# Patient Record
Sex: Male | Born: 1970 | Hispanic: No | Marital: Married | Smoking: Never smoker
Health system: Southern US, Community
[De-identification: ages and names within clinical notes are randomized; demographics above are authoritative.]

## PROBLEM LIST (undated history)

## (undated) DIAGNOSIS — N200 Calculus of kidney: Secondary | ICD-10-CM

---

## 2019-12-04 ENCOUNTER — Other Ambulatory Visit: Payer: Self-pay

## 2019-12-04 ENCOUNTER — Emergency Department (HOSPITAL_COMMUNITY)
Admission: EM | Admit: 2019-12-04 | Discharge: 2019-12-04 | Disposition: A | Discharge status: Home / Self Care | Payer: BLUE CROSS/BLUE SHIELD | Attending: Emergency Medicine | Admitting: Emergency Medicine

## 2019-12-04 ENCOUNTER — Encounter (HOSPITAL_COMMUNITY): Payer: Self-pay | Admitting: Emergency Medicine

## 2019-12-04 ENCOUNTER — Emergency Department (HOSPITAL_COMMUNITY): Payer: BLUE CROSS/BLUE SHIELD

## 2019-12-04 DIAGNOSIS — N201 Calculus of ureter: Secondary | ICD-10-CM | POA: Insufficient documentation

## 2019-12-04 DIAGNOSIS — R109 Unspecified abdominal pain: Secondary | ICD-10-CM | POA: Diagnosis present

## 2019-12-04 LAB — COMPREHENSIVE METABOLIC PANEL
ALT: 28 U/L (ref 0–44)
AST: 24 U/L (ref 15–41)
Albumin: 4.5 g/dL (ref 3.5–5.0)
Alkaline Phosphatase: 51 U/L (ref 38–126)
Anion gap: 12 (ref 5–15)
BUN: 12 mg/dL (ref 6–20)
CO2: 21 mmol/L — ABNORMAL LOW (ref 22–32)
Calcium: 9.6 mg/dL (ref 8.9–10.3)
Chloride: 105 mmol/L (ref 98–111)
Creatinine, Ser: 1.32 mg/dL — ABNORMAL HIGH (ref 0.61–1.24)
GFR calc Af Amer: >60 mL/min (ref >60)
GFR calc non Af Amer: >60 mL/min (ref >60)
Glucose, Bld: 146 mg/dL — ABNORMAL HIGH (ref 70–99)
Potassium: 4.5 mmol/L (ref 3.5–5.1)
Sodium: 138 mmol/L (ref 135–145)
Total Bilirubin: 1.2 mg/dL (ref 0.3–1.2)
Total Protein: 7.6 g/dL (ref 6.5–8.1)

## 2019-12-04 LAB — LIPASE, BLOOD: Lipase: 45 U/L (ref 11–51)

## 2019-12-04 LAB — CBC
HCT: 49.4 % (ref 39.0–52.0)
Hemoglobin: 17 g/dL (ref 13.0–17.0)
MCH: 29.2 pg (ref 26.0–34.0)
MCHC: 34.4 g/dL (ref 30.0–36.0)
MCV: 84.9 fL (ref 80.0–100.0)
Platelets: 322 10*3/uL (ref 150–400)
RBC: 5.82 MIL/uL — ABNORMAL HIGH (ref 4.22–5.81)
RDW: 12 % (ref 11.5–15.5)
WBC: 10.8 10*3/uL — ABNORMAL HIGH (ref 4.0–10.5)
nRBC: 0 % (ref 0.0–0.2)

## 2019-12-04 LAB — URINALYSIS, ROUTINE W REFLEX MICROSCOPIC
Bilirubin Urine: NEGATIVE
Glucose, UA: NEGATIVE mg/dL
Ketones, ur: NEGATIVE mg/dL
Leukocytes,Ua: NEGATIVE
Nitrite: NEGATIVE
Protein, ur: 30 mg/dL — AB
RBC / HPF: >50 RBC/hpf — ABNORMAL HIGH (ref 0–5)
Specific Gravity, Urine: 1.016 (ref 1.005–1.030)
pH: 6 (ref 5.0–8.0)

## 2019-12-04 MED ORDER — KETOROLAC TROMETHAMINE 15 MG/ML IJ SOLN
15.0000 mg | Freq: Once | INTRAMUSCULAR | Status: AC
  Administered 2019-12-04: 11:00:00 15 mg via INTRAVENOUS
  Filled 2019-12-04: qty 1

## 2019-12-04 MED ORDER — IBUPROFEN 600 MG PO TABS: 600.0000 mg | ORAL_TABLET | Freq: Three times a day (TID) | ORAL | 0 refills | Status: DC | PRN

## 2019-12-04 MED ORDER — HYDROCODONE-ACETAMINOPHEN 5-325 MG PO TABS
1.0000 | ORAL_TABLET | Freq: Once | ORAL | Status: AC
  Administered 2019-12-04: 13:00:00 1 via ORAL
  Filled 2019-12-04: qty 1

## 2019-12-04 MED ORDER — HYDROCODONE-ACETAMINOPHEN 5-325 MG PO TABS: 1.0000 | ORAL_TABLET | ORAL | 0 refills | Status: DC | PRN

## 2019-12-04 MED ORDER — ONDANSETRON HCL 4 MG/2ML IJ SOLN
4.0000 mg | Freq: Once | INTRAMUSCULAR | Status: AC
  Administered 2019-12-04: 4 mg via INTRAVENOUS
  Filled 2019-12-04: qty 2

## 2019-12-04 MED ORDER — SODIUM CHLORIDE 0.9 % IV BOLUS
1000.0000 mL | Freq: Once | INTRAVENOUS | Status: AC
  Administered 2019-12-04: 1000 mL via INTRAVENOUS

## 2019-12-04 MED ORDER — HYDROMORPHONE HCL 1 MG/ML IJ SOLN
1.0000 mg | Freq: Once | INTRAMUSCULAR | Status: AC
  Administered 2019-12-04: 10:00:00 1 mg via INTRAVENOUS
  Filled 2019-12-04: qty 1

## 2019-12-04 MED ORDER — HYDROMORPHONE HCL 1 MG/ML IJ SOLN
1.0000 mg | Freq: Once | INTRAMUSCULAR | Status: AC
  Administered 2019-12-04: 11:00:00 1 mg via INTRAVENOUS
  Filled 2019-12-04: qty 1

## 2019-12-04 MED ORDER — SODIUM CHLORIDE 0.9% FLUSH: 3.0000 mL | Freq: Once | INTRAVENOUS | Status: DC

## 2019-12-04 NOTE — ED Provider Notes (Signed)
Andrew [name]   CSN: [id number] Arrival date & time: 12/04/19  0913     History Chief Complaint  Patient presents with  . Flank Pain  . Abdominal Pain    Andrew [name] is a 49 y.o. male.  HPI 49 year old male presents with left back pain.  Started all of a sudden at 4 AM and awoke him from sleep.  Seem to get better and was gone for about 30 minutes but then came back and now is more in his left flank.  Vomited twice.  No fevers, hematuria, or dysuria.  Has never had similar symptoms before.  Took some Advil with no relief.  Pain is severe.  History reviewed. No pertinent past medical history.  There are no problems to display for this patient.   History reviewed. No pertinent surgical history.     No family history on file.  Social History   Tobacco Use  . Smoking status: Never Smoker  . Smokeless tobacco: Never Used  Substance Use Topics  . Alcohol use: Not Currently  . Drug use: Never    Home Medications Prior to Admission medications   Medication Sig Start Date End Date Taking? Authorizing Provider  HYDROcodone-acetaminophen (NORCO) 5-325 MG tablet Take 1 tablet by mouth every 4 (four) hours as needed for severe pain. 12/04/19   Pricilla Loveless, MD  ibuprofen (ADVIL) 600 MG tablet Take 1 tablet (600 mg total) by mouth every 8 (eight) hours as needed for mild pain or moderate pain. 12/04/19   Pricilla Loveless, MD    Allergies    Patient has no known allergies.  Review of Systems   Review of Systems  Constitutional: Negative for fever.  Gastrointestinal: Positive for vomiting. Negative for nausea.  Genitourinary: Positive for flank pain. Negative for dysuria and hematuria.  All other systems reviewed and are negative.   Physical Exam Updated Vital Signs BP (!) 144/86 (BP Location: Right Arm)   Pulse 62   Temp 97.6 F (36.4 C) (Oral)   Resp 18   Ht 6\' 1"  (1.854 m)   Wt 93 kg   SpO2 100%   BMI  27.05 kg/m   Physical Exam Vitals and nursing [name] reviewed.  Constitutional:      Appearance: He is well-developed.  HENT:     Head: Normocephalic and atraumatic.     Right Ear: External ear normal.     Left Ear: External ear normal.     Nose: Nose normal.  Eyes:     General:        Right eye: No discharge.        Left eye: No discharge.  Cardiovascular:     Rate and Rhythm: Normal rate and regular rhythm.     Heart sounds: Normal heart sounds.  Pulmonary:     Effort: Pulmonary effort is normal.     Breath sounds: Normal breath sounds.  Abdominal:     Palpations: Abdomen is soft.     Tenderness: There is no abdominal tenderness. There is no right CVA tenderness or left CVA tenderness.  Musculoskeletal:     Cervical back: Neck supple.  Skin:    General: Skin is warm and dry.  Neurological:     Mental Status: He is alert.  Psychiatric:        Mood and Affect: Mood is not anxious.     ED Results / Procedures / Treatments   Labs (all labs ordered are listed, but only  abnormal results are displayed) Labs Reviewed  COMPREHENSIVE METABOLIC PANEL - Abnormal; Notable for the following components:      Result Value   CO2 21 (*)    Glucose, Bld 146 (*)    Creatinine, Ser 1.32 (*)    All other components within normal limits  CBC - Abnormal; Notable for the following components:   WBC 10.8 (*)    RBC 5.82 (*)    All other components within normal limits  URINALYSIS, ROUTINE W REFLEX MICROSCOPIC - Abnormal; Notable for the following components:   APPearance HAZY (*)    Hgb urine dipstick LARGE (*)    Protein, ur 30 (*)    RBC / HPF >50 (*)    Bacteria, UA RARE (*)    All other components within normal limits  LIPASE, BLOOD    EKG None  Radiology CT Renal Stone Study  Result Date: 12/04/2019 CLINICAL DATA:  Left flank pain with nausea and vomiting beginning the morning at 4am. EXAM: CT ABDOMEN AND PELVIS WITHOUT CONTRAST TECHNIQUE: Multidetector CT imaging of the  abdomen and pelvis was performed following the standard protocol without IV contrast. COMPARISON:  None. FINDINGS: Lower chest: No acute abnormality. Somewhat limited evaluation of the abdominal viscera given the lack of IV contrast. Hepatobiliary: Tiny hypodensity in the posterior left liver (series 4, image 13) is too small fully characterize but likely represents a small cyst. Status post cholecystectomy. No biliary dilatation. Pancreas: Unremarkable. No surrounding inflammatory changes. Spleen: Normal in size without focal abnormality. Adrenals/Urinary Tract: Adrenal glands are unremarkable. Kidneys are symmetric in size. No right renal calculi or hydronephrosis. There is mild left hydronephrosis with a 6 mm calculus proximally at the UPJ. Mild left perinephric fat stranding. There is another punctate calculus in the left kidney. Bladder is unremarkable. Stomach/Bowel: Stomach is within normal limits. Appendix appears normal. No evidence of bowel wall thickening, distention, or inflammatory changes. Few scattered colonic diverticula without evidence of diverticulitis. Vascular/Lymphatic: Minimal aortoiliac atherosclerosis without aneurysm. No enlarged abdominal or pelvic lymph nodes. Reproductive: Prostate is unremarkable. Other: No abdominal wall hernia or abnormality. No abdominopelvic ascites. Musculoskeletal: Degenerative disc disease at L1-2 with likely chronic wedging of the L1 vertebral body secondary to degenerative change. IMPRESSION: 1. Mild left hydronephrosis secondary to a 6 mm calculus proximally at the UPJ. 2.  Lumbar spine degenerative disc disease. Aortic Atherosclerosis (ICD10-I70.0). Electronically Signed   By: Emmaline Kluver M.D.   On: 12/04/2019 10:29    Procedures Procedures (including critical care time)  Medications Ordered in ED Medications  sodium chloride 0.9 % bolus 1,000 mL (0 mLs Intravenous Stopped 12/04/19 1110)  HYDROmorphone (DILAUDID) injection 1 mg (1 mg Intravenous  Given 12/04/19 0945)  ondansetron (ZOFRAN) injection 4 mg (4 mg Intravenous Given 12/04/19 0951)  ketorolac (TORADOL) 15 MG/ML injection 15 mg (15 mg Intravenous Given 12/04/19 1046)  HYDROmorphone (DILAUDID) injection 1 mg (1 mg Intravenous Given 12/04/19 1114)  HYDROcodone-acetaminophen (NORCO/VICODIN) 5-325 MG per tablet 1 tablet (1 tablet Oral Given 12/04/19 1230)    ED Course  I have reviewed the triage vital signs and the nursing notes.  Pertinent labs & imaging results that were available during my care of the patient were reviewed by me and considered in my medical decision making (see chart for details).    MDM Rules/Calculators/A&P                      CT scan confirms ureteral stone and shows no other emergent pathology.  Pain now better after second dose Dilaudid and he got Toradol.  Labs overall benign.  Given some fluids.  No UTI.  Given he seems to have adequate pain control, will treat with NSAIDs and hydrocodone and expectant management.  Follow-up with urology.  Discussed return precautions. Final Clinical Impression(s) / ED Diagnoses Final diagnoses:  Left ureteral stone    Rx / DC Orders ED Discharge Orders         Ordered    HYDROcodone-acetaminophen (NORCO) 5-325 MG tablet  Every 4 hours PRN     12/04/19 1242    ibuprofen (ADVIL) 600 MG tablet  Every 8 hours PRN     12/04/19 1242           Sherwood Gambler, MD 12/04/19 1547

## 2019-12-04 NOTE — Discharge Instructions (Addendum)
Return to the ER if you develop fever, uncontrolled vomiting, new or worsening pain, urinary tract infection symptoms such as burning when you urinate, or any other new/concerning symptoms.  Call the urology office tomorrow.  You are being prescribed Ibuprofen. Do not take Advil/Aleve/Motrin/Goody Powders/Naproxen/BC powders/Meloxicam/Diclofenac/Indomethacin and other Nonsteroidal anti-inflammatory medications

## 2019-12-04 NOTE — ED Triage Notes (Signed)
C/o L flank and L sided abd pain since 4am with nausea and vomiting. Denies urinary complaints.

## 2019-12-05 ENCOUNTER — Other Ambulatory Visit: Payer: Self-pay | Admitting: Urology

## 2019-12-05 ENCOUNTER — Other Ambulatory Visit (HOSPITAL_COMMUNITY)
Admission: RE | Admit: 2019-12-05 | Discharge: 2019-12-05 | Disposition: A | Discharge status: Home / Self Care | Payer: BLUE CROSS/BLUE SHIELD | Source: Ambulatory Visit | Attending: Urology | Admitting: Urology

## 2019-12-05 DIAGNOSIS — N200 Calculus of kidney: Secondary | ICD-10-CM

## 2019-12-05 DIAGNOSIS — Z01812 Encounter for preprocedural laboratory examination: Secondary | ICD-10-CM | POA: Insufficient documentation

## 2019-12-05 DIAGNOSIS — Z20822 Contact with and (suspected) exposure to covid-19: Secondary | ICD-10-CM | POA: Insufficient documentation

## 2019-12-05 LAB — SARS CORONAVIRUS 2 (TAT 6-24 HRS): SARS Coronavirus 2: NEGATIVE

## 2019-12-06 ENCOUNTER — Emergency Department (HOSPITAL_COMMUNITY)
Admission: EM | Admit: 2019-12-06 | Discharge: 2019-12-06 | Disposition: A | Discharge status: Home / Self Care | Payer: BLUE CROSS/BLUE SHIELD | Attending: Emergency Medicine | Admitting: Emergency Medicine

## 2019-12-06 ENCOUNTER — Other Ambulatory Visit: Payer: Self-pay

## 2019-12-06 ENCOUNTER — Encounter (HOSPITAL_COMMUNITY): Payer: Self-pay | Admitting: Emergency Medicine

## 2019-12-06 DIAGNOSIS — N201 Calculus of ureter: Secondary | ICD-10-CM | POA: Diagnosis not present

## 2019-12-06 DIAGNOSIS — R1030 Lower abdominal pain, unspecified: Secondary | ICD-10-CM | POA: Diagnosis present

## 2019-12-06 DIAGNOSIS — N23 Unspecified renal colic: Secondary | ICD-10-CM

## 2019-12-06 HISTORY — DX: Calculus of kidney: N20.0

## 2019-12-06 MED ORDER — HYDROMORPHONE HCL 1 MG/ML IJ SOLN
1.0000 mg | Freq: Once | INTRAMUSCULAR | Status: AC
  Administered 2019-12-06: 1 mg via INTRAVENOUS
  Filled 2019-12-06: qty 1

## 2019-12-06 MED ORDER — SODIUM CHLORIDE 0.9 % IV SOLN
1.5000 mg/kg | Freq: Once | INTRAVENOUS | Status: AC
  Administered 2019-12-06: 140 mg via INTRAVENOUS
  Filled 2019-12-06: qty 7

## 2019-12-06 MED ORDER — ONDANSETRON HCL 4 MG/2ML IJ SOLN
4.0000 mg | Freq: Once | INTRAMUSCULAR | Status: AC
  Administered 2019-12-06: 04:00:00 4 mg via INTRAVENOUS
  Filled 2019-12-06: qty 2

## 2019-12-06 NOTE — Discharge Instructions (Signed)
Please continue taking your pain medication while at home, we discussed if this does not improve you may return to the emergency department.  As we discussed, Dr. Annabell Howells wrote a prescription for hydrocodone, take this medication as prescribed.

## 2019-12-06 NOTE — ED Triage Notes (Signed)
Pt c/o left sided flank pain, diagnosed with kidney stones on 2/14. States he is scheduled for lithotripsy 2/18. C/o increased pain, last voided x 1 hour ago. States he has been taking his pain medicine without relief.

## 2019-12-06 NOTE — ED Provider Notes (Addendum)
[facility] EMERGENCY DEPARTMENT Provider Note   CSN: [id number] Arrival date & time: 12/06/19  0232     History Chief Complaint  Patient presents with   Flank Pain    Andrew [name] is a 49 y.o. male.  The history is provided by the patient and medical records.   49 year old male with history of kidney stones, recently diagnosed with 62mm left ureteral stone on 12/04/19, presenting to the ED for increased pain.  Patient reports he has been taking hydrocodone every 4 hours as directed to help control pain and was doing fairly well until around 1:30 AM.  He took his normal dose of hydrocodone, but had severe pain again 2 hours later, unrelieved by additional dose of hydrocodone.  He reports some nausea but denies any vomiting.  He is not had any fever.  He continues urinating normally.  He is scheduled for lithotripsy with Dr. Marlou Porch on Thursday, 12/08/19.    Past Medical History:  Diagnosis Date   Kidney stones     There are no problems to display for this patient.   History reviewed. No pertinent surgical history.     No family history on file.  Social History   Tobacco Use   Smoking status: Never Smoker   Smokeless tobacco: Never Used  Substance Use Topics   Alcohol use: Not Currently   Drug use: Never    Home Medications Prior to Admission medications   Medication Sig Start Date End Date Taking? Authorizing Provider  HYDROcodone-acetaminophen (NORCO) 5-325 MG tablet Take 1 tablet by mouth every 4 (four) hours as needed for severe pain. 12/04/19   Pricilla Loveless, MD  ibuprofen (ADVIL) 600 MG tablet Take 1 tablet (600 mg total) by mouth every 8 (eight) hours as needed for mild pain or moderate pain. 12/04/19   Pricilla Loveless, MD    Allergies    Patient has no known allergies.  Review of Systems   Review of Systems  Gastrointestinal: Positive for nausea.  Genitourinary: Positive for flank pain.  All other systems reviewed and are  negative.   Physical Exam Updated Vital Signs BP (!) 137/105 (BP Location: Right Arm)    Pulse (!) 55    Temp 97.7 F (36.5 C) (Oral)    Resp 18    SpO2 100%   Physical Exam Vitals and nursing note reviewed.  Constitutional:      Appearance: He is well-developed.     Comments: Appears extremely uncomfortable, pacing and changing position at bedside frequently  HENT:     Head: Normocephalic and atraumatic.  Eyes:     Conjunctiva/sclera: Conjunctivae normal.     Pupils: Pupils are equal, round, and reactive to light.  Cardiovascular:     Rate and Rhythm: Normal rate and regular rhythm.     Heart sounds: Normal heart sounds.  Pulmonary:     Effort: Pulmonary effort is normal. No respiratory distress.     Breath sounds: Normal breath sounds. No rhonchi.  Abdominal:     General: Bowel sounds are normal.     Palpations: Abdomen is soft.  Musculoskeletal:        General: Normal range of motion.     Cervical back: Normal range of motion.  Skin:    General: Skin is warm and dry.  Neurological:     Mental Status: He is alert and oriented to person, place, and time.     ED Results / Procedures / Treatments   Labs (all labs ordered  are listed, but only abnormal results are displayed) Labs Reviewed - No data to display  EKG None  Radiology CT Renal Stone Study  Result Date: 12/04/2019 CLINICAL DATA:  Left flank pain with nausea and vomiting beginning the morning at 4am. EXAM: CT ABDOMEN AND PELVIS WITHOUT CONTRAST TECHNIQUE: Multidetector CT imaging of the abdomen and pelvis was performed following the standard protocol without IV contrast. COMPARISON:  None. FINDINGS: Lower chest: No acute abnormality. Somewhat limited evaluation of the abdominal viscera given the lack of IV contrast. Hepatobiliary: Tiny hypodensity in the posterior left liver (series 4, image 13) is too small fully characterize but likely represents a small cyst. Status post cholecystectomy. No biliary  dilatation. Pancreas: Unremarkable. No surrounding inflammatory changes. Spleen: Normal in size without focal abnormality. Adrenals/Urinary Tract: Adrenal glands are unremarkable. Kidneys are symmetric in size. No right renal calculi or hydronephrosis. There is mild left hydronephrosis with a 6 mm calculus proximally at the UPJ. Mild left perinephric fat stranding. There is another punctate calculus in the left kidney. Bladder is unremarkable. Stomach/Bowel: Stomach is within normal limits. Appendix appears normal. No evidence of bowel wall thickening, distention, or inflammatory changes. Few scattered colonic diverticula without evidence of diverticulitis. Vascular/Lymphatic: Minimal aortoiliac atherosclerosis without aneurysm. No enlarged abdominal or pelvic lymph nodes. Reproductive: Prostate is unremarkable. Other: No abdominal wall hernia or abnormality. No abdominopelvic ascites. Musculoskeletal: Degenerative disc disease at L1-2 with likely chronic wedging of the L1 vertebral body secondary to degenerative change. IMPRESSION: 1. Mild left hydronephrosis secondary to a 6 mm calculus proximally at the UPJ. 2.  Lumbar spine degenerative disc disease. Aortic Atherosclerosis (ICD10-I70.0). Electronically Signed   By: Emmaline Kluver M.D.   On: 12/04/2019 10:29    Procedures Procedures (including critical care time)  Medications Ordered in ED Medications  lidocaine (XYLOCAINE) 140 mg in sodium chloride 0.9 % 100 mL IVPB (has no administration in time range)  HYDROmorphone (DILAUDID) injection 1 mg (1 mg Intravenous Given 12/06/19 0426)  ondansetron (ZOFRAN) injection 4 mg (4 mg Intravenous Given 12/06/19 0426)  HYDROmorphone (DILAUDID) injection 1 mg (1 mg Intravenous Given 12/06/19 0511)  HYDROmorphone (DILAUDID) injection 1 mg (1 mg Intravenous Given 12/06/19 0645)    ED Course  I have reviewed the triage vital signs and the nursing notes.  Pertinent labs & imaging results that were available  during my care of the patient were reviewed by me and considered in my medical decision making (see chart for details).    MDM Rules/Calculators/A&P  49 year old male here with left flank pain.  He was diagnosed with 6 mm left ureteral stone 2 days ago.  He was doing fairly well with home hydrocodone but has since around 0130 he has had worsening pain despite home meds.  Some nausea without vomiting.  On exam here he is pacing and appears very uncomfortable, writhing around on the stretcher.   Most recent labs with SrCr of 1.32 so will avoid toradol.  Will give IV dilaudid and zofran.  5:45 AM Patient is resting comfortably after 2 doses of Dilaudid.  He is drinking water currently.  Will monitor here to ensure pain remains controlled.  6:32 AM Recurrence of pain again, starting to writhe around in bed again.  States he does not feel he will be able to manage this at home.  Will discuss with urology for recommendations as at this point he is getting max 30-45 mins of relief from IV dilaudid.  6:37 AM Discussed with Dr. Annabell Howells-- agrees to avoid  toradol at this point given lithotripsy scheduled for Thursday.  Recommends to try lidocaine infusion and if not improving, may need to transfer to [facility] for further management.  He did have pre-procedure COVID screen yesterday which was negative.  Care will be signed out to oncoming provider to reassess after lidocaine infusion and consult urology if pain not controlled.  Final Clinical Impression(s) / ED Diagnoses Final diagnoses:  Left ureteral stone  Renal colic    Rx / DC Orders ED Discharge Orders    None       Larene Pickett, PA-C 12/06/19 [place], [place], PA-C 12/06/19 [place], Delice Bison, DO 12/06/19 [phone]

## 2019-12-06 NOTE — ED Provider Notes (Signed)
  Physical Exam  BP 135/80   Pulse 73   Temp 97.7 F (36.5 C) (Oral)   Resp (!) 9   SpO2 93%   Physical Exam  ED Course/Procedures     Procedures  MDM  Patient care assumed from [place] S. PA at shift change, please see her note for a full HPI. Briefly, patient diagnosed with 5 mm left ureteral stone on 12/04/19, has been taking medication every 4 hours, but around 1:30am stop having relieved from pain. He is scheduled for lithotripsy with Dr. Marlou Porch on 12/08/19.  Colleague  has spoken to Urology Dr. Annabell Howells, who recommended lidocaine infusion along with reassessment.   8:18 AM patient reassessed by me, reports symptoms have resolved after lidocaine infusion, he is resting stretcher without any discomfort.  We discussed discharge home, he is agreeable of this as pain has subsided, he reports he does have hydrocodone prescription at home to help with his symptoms.  Patient understands that he may return if pain is out of control at home.  Return precautions discussed at length.  Portions of this note were generated with Scientist, clinical (histocompatibility and immunogenetics). Dictation errors may occur despite best attempts at proofreading.        Claude Manges, PA-C 12/06/19 0818    Ward, Layla Maw, DO 12/06/19 1008

## 2019-12-07 NOTE — Progress Notes (Signed)
Patient notified of weather delay. Will arrive at 11:00am tomorrow morning.

## 2019-12-08 ENCOUNTER — Ambulatory Visit (HOSPITAL_BASED_OUTPATIENT_CLINIC_OR_DEPARTMENT_OTHER)
Admission: RE | Admit: 2019-12-08 | Discharge: 2019-12-08 | Disposition: A | Discharge status: Home / Self Care | Payer: BLUE CROSS/BLUE SHIELD | Source: Home / Self Care | Attending: Urology | Admitting: Urology

## 2019-12-08 ENCOUNTER — Other Ambulatory Visit: Payer: Self-pay

## 2019-12-08 ENCOUNTER — Emergency Department (HOSPITAL_COMMUNITY): Payer: BLUE CROSS/BLUE SHIELD

## 2019-12-08 ENCOUNTER — Encounter (HOSPITAL_BASED_OUTPATIENT_CLINIC_OR_DEPARTMENT_OTHER): Payer: Self-pay | Admitting: Urology

## 2019-12-08 ENCOUNTER — Ambulatory Visit (HOSPITAL_COMMUNITY): Payer: BLUE CROSS/BLUE SHIELD

## 2019-12-08 ENCOUNTER — Encounter (HOSPITAL_BASED_OUTPATIENT_CLINIC_OR_DEPARTMENT_OTHER)
Admission: RE | Disposition: A | Discharge status: Home / Self Care | Payer: Self-pay | Source: Home / Self Care | Attending: Urology

## 2019-12-08 ENCOUNTER — Observation Stay (HOSPITAL_COMMUNITY)
Admission: EM | Admit: 2019-12-08 | Discharge: 2019-12-09 | Disposition: A | Discharge status: Home / Self Care | Payer: BLUE CROSS/BLUE SHIELD | Attending: Urology | Admitting: Urology

## 2019-12-08 ENCOUNTER — Encounter (HOSPITAL_COMMUNITY): Payer: Self-pay | Admitting: *Deleted

## 2019-12-08 DIAGNOSIS — N201 Calculus of ureter: Secondary | ICD-10-CM | POA: Diagnosis present

## 2019-12-08 DIAGNOSIS — Z87891 Personal history of nicotine dependence: Secondary | ICD-10-CM | POA: Diagnosis not present

## 2019-12-08 DIAGNOSIS — N132 Hydronephrosis with renal and ureteral calculous obstruction: Principal | ICD-10-CM | POA: Insufficient documentation

## 2019-12-08 DIAGNOSIS — N200 Calculus of kidney: Secondary | ICD-10-CM

## 2019-12-08 DIAGNOSIS — N23 Unspecified renal colic: Secondary | ICD-10-CM

## 2019-12-08 HISTORY — PX: EXTRACORPOREAL SHOCK WAVE LITHOTRIPSY: SHX1557

## 2019-12-08 LAB — URINALYSIS, ROUTINE W REFLEX MICROSCOPIC
Bilirubin Urine: NEGATIVE
Glucose, UA: NEGATIVE mg/dL
Ketones, ur: NEGATIVE mg/dL
Leukocytes,Ua: NEGATIVE
Nitrite: NEGATIVE
Protein, ur: NEGATIVE mg/dL
RBC / HPF: >50 RBC/hpf — ABNORMAL HIGH (ref 0–5)
Specific Gravity, Urine: 1.012 (ref 1.005–1.030)
pH: 7 (ref 5.0–8.0)

## 2019-12-08 LAB — CBC
HCT: 45.2 % (ref 39.0–52.0)
Hemoglobin: 15.9 g/dL (ref 13.0–17.0)
MCH: 29.1 pg (ref 26.0–34.0)
MCHC: 35.2 g/dL (ref 30.0–36.0)
MCV: 82.6 fL (ref 80.0–100.0)
Platelets: 292 10*3/uL (ref 150–400)
RBC: 5.47 MIL/uL (ref 4.22–5.81)
RDW: 11.8 % (ref 11.5–15.5)
WBC: 15.4 10*3/uL — ABNORMAL HIGH (ref 4.0–10.5)
nRBC: 0 % (ref 0.0–0.2)

## 2019-12-08 LAB — BASIC METABOLIC PANEL
Anion gap: 14 (ref 5–15)
BUN: 14 mg/dL (ref 6–20)
CO2: 20 mmol/L — ABNORMAL LOW (ref 22–32)
Calcium: 9.3 mg/dL (ref 8.9–10.3)
Chloride: 101 mmol/L (ref 98–111)
Creatinine, Ser: 1.52 mg/dL — ABNORMAL HIGH (ref 0.61–1.24)
GFR calc Af Amer: >60 mL/min (ref >60)
GFR calc non Af Amer: 53 mL/min — ABNORMAL LOW (ref >60)
Glucose, Bld: 126 mg/dL — ABNORMAL HIGH (ref 70–99)
Potassium: 3.6 mmol/L (ref 3.5–5.1)
Sodium: 135 mmol/L (ref 135–145)

## 2019-12-08 SURGERY — LITHOTRIPSY, ESWL
Anesthesia: LOCAL | Laterality: Left

## 2019-12-08 MED ORDER — FENTANYL CITRATE (PF) 100 MCG/2ML IJ SOLN
50.0000 ug | INTRAMUSCULAR | Status: DC | PRN
  Administered 2019-12-08: 50 ug via INTRAVENOUS
  Filled 2019-12-08: qty 2

## 2019-12-08 MED ORDER — DIAZEPAM 5 MG PO TABS
10.0000 mg | ORAL_TABLET | ORAL | Status: AC
  Administered 2019-12-08: 10 mg via ORAL
  Filled 2019-12-08: qty 2

## 2019-12-08 MED ORDER — CIPROFLOXACIN HCL 500 MG PO TABS
ORAL_TABLET | ORAL | Status: AC
  Filled 2019-12-08: qty 1

## 2019-12-08 MED ORDER — DIPHENHYDRAMINE HCL 25 MG PO CAPS
ORAL_CAPSULE | ORAL | Status: AC
  Filled 2019-12-08: qty 1

## 2019-12-08 MED ORDER — TAMSULOSIN HCL 0.4 MG PO CAPS: 0.4000 mg | ORAL_CAPSULE | Freq: Every day | ORAL | 0 refills | Status: DC

## 2019-12-08 MED ORDER — SODIUM CHLORIDE 0.9 % IV SOLN
INTRAVENOUS | Status: DC
  Filled 2019-12-08: qty 1000

## 2019-12-08 MED ORDER — SODIUM CHLORIDE 0.9 % IV BOLUS
1000.0000 mL | Freq: Once | INTRAVENOUS | Status: AC
  Administered 2019-12-08: 1000 mL via INTRAVENOUS

## 2019-12-08 MED ORDER — HYDROMORPHONE HCL 1 MG/ML IJ SOLN
1.0000 mg | Freq: Once | INTRAMUSCULAR | Status: AC
  Administered 2019-12-08: 1 mg via INTRAVENOUS
  Filled 2019-12-08: qty 1

## 2019-12-08 MED ORDER — OXYCODONE HCL 5 MG PO TABS
ORAL_TABLET | ORAL | Status: AC
  Filled 2019-12-08: qty 1

## 2019-12-08 MED ORDER — ONDANSETRON HCL 4 MG/2ML IJ SOLN
4.0000 mg | Freq: Once | INTRAMUSCULAR | Status: AC
  Administered 2019-12-08: 4 mg via INTRAVENOUS
  Filled 2019-12-08: qty 2

## 2019-12-08 MED ORDER — HYDROCODONE-ACETAMINOPHEN 5-325 MG PO TABS: 1.0000 | ORAL_TABLET | ORAL | 0 refills | Status: DC | PRN

## 2019-12-08 MED ORDER — OXYCODONE HCL 5 MG PO TABS
5.0000 mg | ORAL_TABLET | ORAL | Status: DC | PRN
  Administered 2019-12-08: 5 mg via ORAL
  Filled 2019-12-08: qty 2

## 2019-12-08 MED ORDER — CIPROFLOXACIN HCL 500 MG PO TABS
500.0000 mg | ORAL_TABLET | ORAL | Status: AC
  Administered 2019-12-08: 500 mg via ORAL
  Filled 2019-12-08: qty 1

## 2019-12-08 MED ORDER — DIPHENHYDRAMINE HCL 25 MG PO CAPS
25.0000 mg | ORAL_CAPSULE | ORAL | Status: AC
  Administered 2019-12-08: 25 mg via ORAL
  Filled 2019-12-08: qty 1

## 2019-12-08 MED ORDER — ONDANSETRON HCL 4 MG PO TABS: 4.0000 mg | ORAL_TABLET | Freq: Three times a day (TID) | ORAL | 0 refills | Status: AC | PRN

## 2019-12-08 MED ORDER — DIAZEPAM 5 MG PO TABS
ORAL_TABLET | ORAL | Status: AC
  Filled 2019-12-08: qty 2

## 2019-12-08 MED ORDER — HYDROMORPHONE HCL 1 MG/ML IJ SOLN
1.0000 mg | Freq: Once | INTRAMUSCULAR | Status: AC
  Administered 2019-12-08: 23:00:00 1 mg via INTRAVENOUS
  Filled 2019-12-08: qty 1

## 2019-12-08 NOTE — H&P (Signed)
have ureteral stone.  HPI: Andrew [name] is a 49 year-old male patient who was referred by Dr. Sherwood Gambler, MD who is here for ureteral stone.  The problem is on the left side.   Andrew [name] is a 49 yo male who was seen in the ER on 2/14 for left flank pain and was found to have 34m stone just below the left UPJ. He got pain relief with dilaudid and toradol in the ER. He continues to have intermttent pain and he took hydrocodone with relief. He has severe nausea but he can't vomit. He doesn't have a nausea med. He has no prior history of stones. He has had no hematuria or voiding symptoms. He has no other GU history.      ALLERGIES: None   MEDICATIONS: Hydrocodone-Acetaminophen  Ibuprofen     GU PSH: None   NON-GU PSH: Cholecystectomy (laparoscopic)     GU PMH: None   NON-GU PMH: Other intervertebral disc degeneration, lumbar region    FAMILY HISTORY: Heart Attack - Father kidney stone - Father   SOCIAL HISTORY: Marital Status: Married Preferred Language: English Current Smoking Status: Patient does not smoke anymore.   Tobacco Use Assessment Completed: Used Tobacco in last 30 days? Drinks 3 caffeinated drinks per day. Patient's occupation [employer]   REVIEW OF SYSTEMS:    GU Review Male:   Patient reports frequent urination and get up at night to urinate. Patient denies hard to postpone urination, burning/ pain with urination, leakage of urine, stream starts and stops, trouble starting your stream, have to strain to urinate , erection problems, and penile pain.  Gastrointestinal (Upper):   Patient reports nausea and vomiting. Patient denies indigestion/ heartburn.  Gastrointestinal (Lower):   Patient denies diarrhea and constipation.  Constitutional:   Patient reports fatigue. Patient denies fever, night sweats, and weight loss.  Skin:   Patient denies skin rash/ lesion and itching.  Eyes:   Patient denies blurred vision and double vision.   Ears/ Nose/ Throat:   Patient reports sinus problems. Patient denies sore throat.  Hematologic/Lymphatic:   Patient denies swollen glands and easy bruising.  Cardiovascular:   Patient denies leg swelling and chest pains.  Respiratory:   Patient denies cough and shortness of breath.  Endocrine:   Patient denies excessive thirst.  Musculoskeletal:   Patient denies back pain and joint pain.  Neurological:   Patient reports headaches. Patient denies dizziness.  Psychologic:   Patient denies depression and anxiety.   VITAL SIGNS:      12/05/2019 11:22 AM  Weight 205 lb / 92.99 kg  Height 73 in / 185.42 cm  BP 125/79 mmHg  Pulse 57 /min  Temperature 97.1 F / 36.1 C  BMI 27.0 kg/m   MULTI-SYSTEM PHYSICAL EXAMINATION:    Constitutional: Well-nourished. No physical deformities. Normally developed. Good grooming.  Neck: Neck symmetrical, not swollen. Normal tracheal position.  Respiratory: Normal breath sounds. No labored breathing, no use of accessory muscles.   Cardiovascular: Regular rate and rhythm. No murmur, no gallop.   Skin: No paleness, no jaundice, no cyanosis. No lesion, no ulcer, no rash.  Neurologic / Psychiatric: Oriented to time, oriented to place, oriented to person. No depression, no anxiety, no agitation.  Gastrointestinal: No mass, no tenderness, no rigidity, non obese abdomen.  Musculoskeletal: Normal gait and station of head and neck.     PAST DATA REVIEWED:  Source Of History:  Patient  Lab Test Review:   BMP, CBC with Diff  Urine Test Review:   Urinalysis  X-Ray Review: KUB: Reviewed Films. Discussed With Patient.  C.T. Stone Protocol: Reviewed Films. Reviewed Report. Discussed With Patient.    Notes:                     His Cr. was 1.32 and WBC 10.8 in the ER. He had >50 RBC's on UA.    PROCEDURES:         KUB - [id number]  A single view of the abdomen is obtained. There is a 25m calcifiation in the area of the [place] with his known stone. He has clips in  the RUQ. No significant bone, gas or soft tissue abnormalities are noted.       Patient confirmed No Neulasta OnPro Device.           Urinalysis w/Scope Dipstick Dipstick Cont'd Micro  Color: Yellow Bilirubin: Neg mg/dL WBC/hpf: NS (Not Seen)  Appearance: Clear Ketones: Neg mg/dL RBC/hpf: 3 - 10/hpf  Specific Gravity: 1.025 Blood: Trace ery/uL Bacteria: Rare (0-9/hpf)  pH: 5.5 Protein: Trace mg/dL Cystals: NS (Not Seen)  Glucose: Neg mg/dL Urobilinogen: 0.2 mg/dL Casts: NS (Not Seen)    Nitrites: Neg Trichomonas: Not Present    Leukocyte Esterase: Neg leu/uL Mucous: Present      Epithelial Cells: NS (Not Seen)      Yeast: NS (Not Seen)      Sperm: Not Present         Phenergan 27m- [id number] Qty: 25 Adm. By: ErAlcide GoodnessUnit: mg Lot No [id number]: IM Exp. Date 11/20/2020  Freq: None Mfgr.:   Site: Left Hip   ASSESSMENT:      ICD-10 Details  1 GU:   Ureteral calculus - N20.1 Left, Acute, Systemic Symptoms - I discussed options for his stone including MET, ESWL and URS and will try to get him scheduled for ESWL this week. I reviewed the risks of ESWL including bleeding, infection, injury to the kidney or adjacent structures, failure to fragment the stone, need for ancillary procedures, thrombotic events, cardiac arrhythmias and sedation complications.   2 NON-GU:   Nausea - R11.0 Acute, Systemic Symptoms   PLAN:            Medications New Meds: Hydrocodone-Acetaminophen 5 mg-325 mg tablet 1 tablet PO Q 4 H PRN   #10  0 Refill(s)  Ondansetron Odt 4 mg tablet,disintegrating 1 tablet PO Q 6 H PRN prn nausea. use before trying phenergan.  #10  1 Refill(s)  Promethazine Hcl 25 mg tablet 1 tablet PO Q 6 H PRN   #10  1 Refill(s)            Orders X-Rays: KUB          Schedule Procedure: [facility] [id number] Per 50 Mg) - [id number]: 12/08/2019 - ESWL - [id number]

## 2019-12-08 NOTE — ED Provider Notes (Signed)
[facility] EMERGENCY DEPARTMENT Provider Note   CSN: [id number] Arrival date & time: 12/08/19  2013     History Chief Complaint  Patient presents with  . Flank Pain    Andrew [name] is a 49 y.o. male visit of kidney stones underwent lithotripsy earlier today presented to emergency department worsening left flank pain.  He reports he woke up from his procedure with severe pain in his left flank.  He has not gotten any better is getting worse.  He says the pain is 10 out of 10 he feels nauseated.  The procedure was for lithotripsy for 6 mm calculus proximal to the UPJ.  HPI     Past Medical History:  Diagnosis Date  . Kidney stones     There are no problems to display for this patient.   History reviewed. No pertinent surgical history.     No family history on file.  Social History   Tobacco Use  . Smoking status: Never Smoker  . Smokeless tobacco: Never Used  Substance Use Topics  . Alcohol use: Not Currently  . Drug use: Never    Home Medications Prior to Admission medications   Medication Sig Start Date End Date Taking? Authorizing Provider  HYDROcodone-acetaminophen (NORCO) 5-325 MG tablet Take 1 tablet by mouth every 4 (four) hours as needed for severe pain. 12/08/19  Yes Crist Fat, MD  ondansetron (ZOFRAN) 4 MG tablet Take 1 tablet (4 mg total) by mouth every 8 (eight) hours as needed for nausea or vomiting. 12/08/19  Yes Crist Fat, MD  ibuprofen (ADVIL) 600 MG tablet Take 1 tablet (600 mg total) by mouth every 8 (eight) hours as needed for mild pain or moderate pain. Patient not taking: Reported on 12/08/2019 12/04/19   Pricilla Loveless, MD  tamsulosin (FLOMAX) 0.4 MG CAPS capsule Take 1 capsule (0.4 mg total) by mouth daily. Patient not taking: Reported on 12/08/2019 12/08/19   Crist Fat, MD    Allergies    Patient has no known allergies.  Review of Systems   Review of Systems  Constitutional: Negative  for chills and fever.  Gastrointestinal: Positive for abdominal pain and nausea. Negative for vomiting.  Genitourinary: Positive for flank pain.  Psychiatric/Behavioral: Negative for agitation and confusion.  All other systems reviewed and are negative.   Physical Exam Updated Vital Signs BP (!) 161/85   Pulse 62   Temp 97.6 F (36.4 C) (Oral)   Resp 16   SpO2 100%   Physical Exam Vitals and nursing note reviewed.  Constitutional:      General: He is in acute distress.     Appearance: He is well-developed. He is diaphoretic.     Comments: Moaning  HENT:     Head: Normocephalic and atraumatic.  Cardiovascular:     Rate and Rhythm: Normal rate and regular rhythm.     Pulses: Normal pulses.  Pulmonary:     Effort: Pulmonary effort is normal. No respiratory distress.  Abdominal:     Tenderness: There is no abdominal tenderness. There is left CVA tenderness. There is no guarding or rebound.  Musculoskeletal:     Cervical back: Neck supple.  Skin:    General: Skin is warm.  Neurological:     Mental Status: He is alert.     ED Results / Procedures / Treatments   Labs (all labs ordered are listed, but only abnormal results are displayed) Labs Reviewed  URINALYSIS, ROUTINE W REFLEX MICROSCOPIC -  Abnormal; Notable for the following components:      Result Value   Hgb urine dipstick LARGE (*)    RBC / HPF >50 (*)    Bacteria, UA RARE (*)    All other components within normal limits  BASIC METABOLIC PANEL - Abnormal; Notable for the following components:   CO2 20 (*)    Glucose, Bld 126 (*)    Creatinine, Ser 1.52 (*)    GFR calc non Af Amer 53 (*)    All other components within normal limits  CBC - Abnormal; Notable for the following components:   WBC 15.4 (*)    All other components within normal limits    EKG None  Radiology DG Abd 1 View  Result Date: 12/08/2019 CLINICAL DATA:  Left-sided renal stone. EXAM: ABDOMEN - 1 VIEW COMPARISON:  10/03/2020. FINDINGS:  Previously identified left renal stone is now projected over the left upper ureter at the level of L3. Pelvic calcifications again noted consistent phleboliths. Stool noted throughout the colon. No bowel distention. No acute bony abnormality. IMPRESSION: Previous identified left renal stone is now projected over the left upper ureter at the level of L3. Electronically Signed   By: Maisie Fus  Register   On: 12/08/2019 10:51   CT Renal Stone Study  Result Date: 12/08/2019 CLINICAL DATA:  Lithotripsy for left-sided kidney stone today. Worsening of left-sided pain. A EXAM: CT ABDOMEN AND PELVIS WITHOUT CONTRAST TECHNIQUE: Multidetector CT imaging of the abdomen and pelvis was performed following the standard protocol without IV contrast. COMPARISON:  12/04/2019 FINDINGS: Lower chest: Normal Hepatobiliary: Liver appears normal.  Previous cholecystectomy. Pancreas: Normal Spleen: Normal Adrenals/Urinary Tract: Adrenal glands are normal. Right kidney is normal. Left kidney shows swelling and hydronephrosis. 2 mm nonobstructing stone dependent within the lower pole is unchanged. Stone present within the proximal ureter lower than was seen on the previous study, just below the level of the lower pole of the kidney. This measures maximally 6 mm. No stone seen distal to that. No stone in the bladder. No evidence of regional hemorrhage. Pyelosinus extravasation is slightly more pronounced than seen previously. Stomach/Bowel: Normal Vascular/Lymphatic: Minimal aortoiliac atherosclerotic disease. Reproductive: Normal Other: No free fluid or air. Musculoskeletal: Mild chronic curvature and degenerative change. No acute finding. IMPRESSION: 6 mm stone previously seen at the left UPJ has migrated about 4 cm distal, just below the lower pole of the left kidney. This does not appear to be fragmented. Nonobstructing 2 mm stone in the lower pole is unchanged. Persistent hydronephrosis. More surrounding retroperitoneal edema consistent  with pyelo sinus extravasation. No evidence of postprocedural hematoma. Electronically Signed   By: Paulina Fusi M.D.   On: 12/08/2019 22:16    Procedures Procedures (including critical care time)  Medications Ordered in ED Medications  fentaNYL (SUBLIMAZE) injection 50 mcg (50 mcg Intravenous Given 12/08/19 2053)  HYDROmorphone (DILAUDID) injection 1 mg (1 mg Intravenous Given 12/08/19 2211)  sodium chloride 0.9 % bolus 1,000 mL (1,000 mLs Intravenous New Bag/Given 12/08/19 2212)  ondansetron (ZOFRAN) injection 4 mg (4 mg Intravenous Given 12/08/19 2225)  HYDROmorphone (DILAUDID) injection 1 mg (1 mg Intravenous Given 12/08/19 2310)    ED Course  I have reviewed the triage vital signs and the nursing notes.  Pertinent labs & imaging results that were available during my care of the patient were reviewed by me and considered in my medical decision making (see chart for details).  49 yo male presenting to ED with persistent left flank pain s/p  lithotripsy this morning for 6 mm left UPJ renal stone.  Repeat CT here shows advancement of same stone 4 cm down ureter, and same hydro in the kidney.  UA without clear evidence of infection.  Minor increase in WBC, which I discussed with Dr Alyson Ingles of urology, but this can be expected after a procedure, and in the absence of fever or UA infection, he does not recommend antibiotics at this time.  Patient does not appear septic or HD stable.  We'll give IV pain meds, nausea meds, and he'll need to be transferred to [facility].  Clinical Course as of Dec 07 2336  Thu Dec 08, 2019  2241 I spoke to Dr Alyson Ingles of urology who reviewed the films, states he does not think this is pyelonephritis, likely continued renal pain.  He recommended ED to ED transfer to [facility] and admission there for pain control.  Unclear at this point if they would plan another intervention, but he denies that any intervention would occur this evening.  I relayed this to the patient who says he wants  to stay because he can't manage the pain at home.  I offered to let him go by POV if someone can drive but he has no ride, and prefers to wait for an ambulance.   [MT]    Clinical Course User Index [MT] Lain Tetterton, Carola Rhine, MD   Final Clinical Impression(s) / ED Diagnoses Final diagnoses:  Ureteral colic  Kidney stone    Rx / DC Orders ED Discharge Orders    None       Wyvonnia Dusky, MD 12/08/19 2338

## 2019-12-08 NOTE — Op Note (Signed)
See [facility] OP note scanned into chart. Also because of the size, density, location and other factors that cannot be anticipated I feel this will likely be a staged procedure. This fact supersedes any indication in the scanned [facility] operative note to the contrary.  

## 2019-12-08 NOTE — Discharge Instructions (Signed)
Post Anesthesia Home Care Instructions  Activity: Get plenty of rest for the remainder of the day. A responsible adult should stay with you for 24 hours following the procedure.  For the next 24 hours, DO NOT: -Drive a car -Operate machinery -Drink alcoholic beverages -Take any medication unless instructed by your physician -Make any legal decisions or sign important papers.  Meals: Start with liquid foods such as gelatin or soup. Progress to regular foods as tolerated. Avoid greasy, spicy, heavy foods. If nausea and/or vomiting occur, drink only clear liquids until the nausea and/or vomiting subsides. Call your physician if vomiting continues.  Special Instructions/Symptoms: Your throat may feel dry or sore from the anesthesia or the breathing tube placed in your throat during surgery. If this causes discomfort, gargle with warm salt water. The discomfort should disappear within 24 hours.  If you had a scopolamine patch placed behind your ear for the management of post- operative nausea and/or vomiting:  1. The medication in the patch is effective for 72 hours, after which it should be removed.  Wrap patch in a tissue and discard in the trash. Wash hands thoroughly with soap and water. 2. You may remove the patch earlier than 72 hours if you experience unpleasant side effects which may include dry mouth, dizziness or visual disturbances. 3. Avoid touching the patch. Wash your hands with soap and water after contact with the patch.    Lithotripsy, Care After This sheet gives you information about how to care for yourself after your procedure. Your health care provider may also give you more specific instructions. If you have problems or questions, contact your health care provider. What can I expect after the procedure? After the procedure, it is common to have:  Some blood in your urine. This should only last for a few days.  Soreness in your back, sides, or upper abdomen for a few  days.  Blotches or bruises on your back where the pressure wave entered the skin.  Pain, discomfort, or nausea when pieces (fragments) of the kidney stone move through the tube that carries urine from the kidney to the bladder (ureter). Stone fragments may pass soon after the procedure, but they may continue to pass for up to 4-8 weeks. ? If you have severe pain or nausea, contact your health care provider. This may be caused by a large stone that was not broken up, and this may mean that you need more treatment.  Some pain or discomfort during urination.  Some pain or discomfort in the lower abdomen or (in men) at the base of the penis. Follow these instructions at home: Medicines  Take over-the-counter and prescription medicines only as told by your health care provider.  If you were prescribed an antibiotic medicine, take it as told by your health care provider. Do not stop taking the antibiotic even if you start to feel better.  Do not drive for 24 hours if you were given a medicine to help you relax (sedative).  Do not drive or use heavy machinery while taking prescription pain medicine. Eating and drinking      Drink enough water and fluids to keep your urine clear or pale yellow. This helps any remaining pieces of the stone to pass. It can also help prevent new stones from forming.  Eat plenty of fresh fruits and vegetables.  Follow instructions from your health care provider about eating and drinking restrictions. You may be instructed: ? To reduce how much salt (sodium) you   eat or drink. Check ingredients and nutrition facts on packaged foods and beverages. ? To reduce how much meat you eat.  Eat the recommended amount of calcium for your age and gender. Ask your health care provider how much calcium you should have. General instructions  Get plenty of rest.  Most people can resume normal activities 1-2 days after the procedure. Ask your health care provider what  activities are safe for you.  Your health care provider may direct you to lie in a certain position (postural drainage) and tap firmly (percuss) over your kidney area to help stone fragments pass. Follow instructions as told by your health care provider.  If directed, strain all urine through the strainer that was provided by your health care provider. ? Keep all fragments for your health care provider to see. Any stones that are found may be sent to a medical lab for examination. The stone may be as small as a grain of salt.  Keep all follow-up visits as told by your health care provider. This is important. Contact a health care provider if:  You have pain that is severe or does not get better with medicine.  You have nausea that is severe or does not go away.  You have blood in your urine longer than your health care provider told you to expect.  You have more blood in your urine.  You have pain during urination that does not go away.  You urinate more frequently than usual and this does not go away.  You develop a rash or any other possible signs of an allergic reaction. Get help right away if:  You have severe pain in your back, sides, or upper abdomen.  You have severe pain while urinating.  Your urine is very dark red.  You have blood in your stool (feces).  You cannot pass any urine at all.  You feel a strong urge to urinate after emptying your bladder.  You have a fever or chills.  You develop shortness of breath, difficulty breathing, or chest pain.  You have severe nausea that leads to persistent vomiting.  You faint. Summary  After this procedure, it is common to have some pain, discomfort, or nausea when pieces (fragments) of the kidney stone move through the tube that carries urine from the kidney to the bladder (ureter). If this pain or nausea is severe, however, you should contact your health care provider.  Most people can resume normal activities 1-2  days after the procedure. Ask your health care provider what activities are safe for you.  Drink enough water and fluids to keep your urine clear or pale yellow. This helps any remaining pieces of the stone to pass, and it can help prevent new stones from forming.  If directed, strain your urine and keep all fragments for your health care provider to see. Fragments or stones may be as small as a grain of salt.  Get help right away if you have severe pain in your back, sides, or upper abdomen or have severe pain while urinating. This information is not intended to replace advice given to you by your health care provider. Make sure you discuss any questions you have with your health care provider. Document Revised: 01/17/2019 Document Reviewed: 08/27/2016 Elsevier Patient Education  2020 [employer]. See [facility] discharge instructions in chart. 

## 2019-12-08 NOTE — ED Triage Notes (Signed)
Pt reporting lithotripsy this morning, started having left flank pain about 1 hour after procedure was complete. Last hydrocodone about 3 hours ago, last zofran about 1 hour ago. Pt is pale, diaphoretic, and moaning in triage.

## 2019-12-09 DIAGNOSIS — N201 Calculus of ureter: Secondary | ICD-10-CM | POA: Diagnosis present

## 2019-12-09 DIAGNOSIS — N200 Calculus of kidney: Secondary | ICD-10-CM

## 2019-12-09 LAB — HIV ANTIBODY (ROUTINE TESTING W REFLEX): HIV Screen 4th Generation wRfx: NONREACTIVE

## 2019-12-09 MED ORDER — HYDROMORPHONE HCL 1 MG/ML IJ SOLN: 0.5000 mg | INTRAMUSCULAR | Status: DC | PRN

## 2019-12-09 MED ORDER — ONDANSETRON HCL 4 MG/2ML IJ SOLN
4.0000 mg | Freq: Once | INTRAMUSCULAR | Status: AC
  Administered 2019-12-09: 4 mg via INTRAVENOUS

## 2019-12-09 MED ORDER — ONDANSETRON HCL 4 MG/2ML IJ SOLN
INTRAMUSCULAR | Status: AC
  Administered 2019-12-09: 4 mg
  Filled 2019-12-09: qty 2

## 2019-12-09 MED ORDER — ZOLPIDEM TARTRATE 5 MG PO TABS: 5.0000 mg | ORAL_TABLET | Freq: Every evening | ORAL | Status: DC | PRN

## 2019-12-09 MED ORDER — SODIUM CHLORIDE 0.9 % IV SOLN
1.5000 mg/kg | Freq: Once | INTRAVENOUS | Status: AC
  Administered 2019-12-09: 142 mg via INTRAVENOUS
  Filled 2019-12-09: qty 7.1

## 2019-12-09 MED ORDER — OXYCODONE-ACETAMINOPHEN 5-325 MG PO TABS: 1.0000 | ORAL_TABLET | ORAL | Status: DC | PRN

## 2019-12-09 MED ORDER — ONDANSETRON HCL 4 MG/2ML IJ SOLN: 4.0000 mg | INTRAMUSCULAR | Status: DC | PRN

## 2019-12-09 MED ORDER — ONDANSETRON HCL 4 MG/2ML IJ SOLN: 4.0000 mg | Freq: Three times a day (TID) | INTRAMUSCULAR | Status: DC | PRN

## 2019-12-09 MED ORDER — HYDROMORPHONE HCL 2 MG PO TABS: 2.0000 mg | ORAL_TABLET | ORAL | 0 refills | Status: AC | PRN

## 2019-12-09 MED ORDER — HYDROMORPHONE HCL 1 MG/ML IJ SOLN: 0.5000 mg | INTRAMUSCULAR | Status: AC | PRN

## 2019-12-09 MED ORDER — DIPHENHYDRAMINE HCL 50 MG/ML IJ SOLN: 12.5000 mg | Freq: Four times a day (QID) | INTRAMUSCULAR | Status: DC | PRN

## 2019-12-09 MED ORDER — TAMSULOSIN HCL 0.4 MG PO CAPS: 0.4000 mg | ORAL_CAPSULE | Freq: Every day | ORAL | 0 refills | Status: DC

## 2019-12-09 MED ORDER — HYDROMORPHONE HCL 1 MG/ML IJ SOLN
1.0000 mg | Freq: Once | INTRAMUSCULAR | Status: AC
  Administered 2019-12-09: 01:00:00 1 mg via INTRAVENOUS
  Filled 2019-12-09: qty 1

## 2019-12-09 MED ORDER — SODIUM CHLORIDE 0.9 % IV SOLN: INTRAVENOUS | Status: DC

## 2019-12-09 MED ORDER — DIPHENHYDRAMINE HCL 12.5 MG/5ML PO ELIX: 12.5000 mg | ORAL_SOLUTION | Freq: Four times a day (QID) | ORAL | Status: DC | PRN

## 2019-12-09 NOTE — ED Notes (Signed)
4mg  zofran administered prior to transport

## 2019-12-09 NOTE — ED Provider Notes (Signed)
   12:31 AM Patient arrives from [facility] for continued left flank pain after lithotripsy this morning.  I personally evaluated patient a few days ago at [facility] for same.  States pain has not really improved after his lithotripsy procedure, they do not feel his stone fully broke up, did descend slightly.  Nausea has been controlled but pain is coming back and he does appear uncomfortable.  Will redose pain medication and discuss with urology.  1:08 AM Discussed with Dr. Ronne Binning with urology-- will admit for pain control, I have placed temporary orders.  Patient did have good relief a few days ago with lidocaine infusion, no contraindication to repeating this today per urology so will repeat.   Garlon Hatchet, PA-C 12/13/19 2352    Nira Conn, MD 12/15/19 [phone]

## 2019-12-09 NOTE — Progress Notes (Signed)
D/C instructions given to patient. Patient had no questions. NT or writer will wheel patient out once his family comes to pick him up

## 2019-12-09 NOTE — ED Notes (Signed)
[facility] has arrived for transport to [facility].

## 2019-12-09 NOTE — H&P (Signed)
Urology Admission H&P  Chief Complaint: left flank pain  History of Present Illness: Mr Andrew [name] is a 49yo with a hx fo nephrolithiasis who underwent ESWL yesterday. He presented to the ER this morning with worsening left flank pain poorly controlled with norco. The pain is sharp, constant, severe, nonradiating. He has associated nausea but no vomiting. NO fevers/chills/sweats. No other associated symptoms. No exacerbating/alleviating events. CT stone study shows a 66mm left proximal ureteral calculus with mild to moderate hydronephrosis  Past Medical History:  Diagnosis Date  . Kidney stones    History reviewed. No pertinent surgical history.  Home Medications:  Current Facility-Administered Medications  Medication Dose Route Frequency Provider Last Rate Last Admin  . 0.9 %  sodium chloride infusion   Intravenous Continuous Malen Gauze, MD 75 mL/hr at 12/09/19 0219 New Bag at 12/09/19 0219  . diphenhydrAMINE (BENADRYL) injection 12.5-25 mg  12.5-25 mg Intravenous Q6H PRN Malen Gauze, MD       Or  . diphenhydrAMINE (BENADRYL) 12.5 MG/5ML elixir 12.5-25 mg  12.5-25 mg Oral Q6H PRN Malen Gauze, MD      . fentaNYL (SUBLIMAZE) injection 50 mcg  50 mcg Intravenous Q30 min PRN Terald Sleeper, MD   50 mcg at 12/08/19 2053  . HYDROmorphone (DILAUDID) injection 0.5 mg  0.5 mg Intravenous Q4H PRN Garlon Hatchet, PA-C      . HYDROmorphone (DILAUDID) injection 0.5-1 mg  0.5-1 mg Intravenous Q2H PRN Malen Gauze, MD      . ondansetron (ZOFRAN) injection 4 mg  4 mg Intravenous Q4H PRN Ryelle Ruvalcaba, Mardene Celeste, MD      . oxyCODONE-acetaminophen (PERCOCET/ROXICET) 5-325 MG per tablet 1-2 tablet  1-2 tablet Oral Q4H PRN Vanette Noguchi, Mardene Celeste, MD      . zolpidem (AMBIEN) tablet 5 mg  5 mg Oral QHS PRN,MR X 1 Zelina Jimerson, Mardene Celeste, MD       Allergies: No Known Allergies  No family history on file. Social History:  reports that he has never smoked. He has never used smokeless  tobacco. He reports previous alcohol use. He reports that he does not use drugs.  Review of Systems  Gastrointestinal: Positive for nausea.  Genitourinary: Positive for flank pain.  All other systems reviewed and are negative.   Physical Exam:  Vital signs in last 24 hours: Temp:  [97.6 F (36.4 C)-98.3 F (36.8 C)] 98.3 F (36.8 C) (02/19 0419) Pulse Rate:  [62-80] 80 (02/19 0419) Resp:  [12-19] 16 (02/19 0419) BP: (136-169)/(83-97) 152/83 (02/19 0419) SpO2:  [93 %-100 %] 96 % (02/19 0419) Weight:  [94 kg-94.3 kg] 94 kg (02/19 0232) Physical Exam  Constitutional: He is oriented to person, place, and time. He appears well-developed and well-nourished.  HENT:  Head: Normocephalic and atraumatic.  Eyes: Pupils are equal, round, and reactive to light. EOM are normal.  Neck: No thyromegaly present.  Cardiovascular: Normal rate and regular rhythm.  Respiratory: Effort normal. No respiratory distress.  GI: Soft. He exhibits no distension.  Musculoskeletal:        General: No edema. Normal range of motion.     Cervical back: Normal range of motion.  Neurological: He is alert and oriented to person, place, and time.  Skin: Skin is warm and dry.  Psychiatric: He has a normal mood and affect. His behavior is normal. Judgment and thought content normal.    Laboratory Data:  Results for orders placed or performed during the hospital encounter of 12/08/19 (from the past  24 hour(s))  Urinalysis, Routine w reflex microscopic- may I&O cath if menses     Status: Abnormal   Collection Time: 12/08/19  8:29 PM  Result Value Ref Range   Color, Urine YELLOW YELLOW   APPearance CLEAR CLEAR   Specific Gravity, Urine 1.012 1.005 - 1.030   pH 7.0 5.0 - 8.0   Glucose, UA NEGATIVE NEGATIVE mg/dL   Hgb urine dipstick LARGE (A) NEGATIVE   Bilirubin Urine NEGATIVE NEGATIVE   Ketones, ur NEGATIVE NEGATIVE mg/dL   Protein, ur NEGATIVE NEGATIVE mg/dL   Nitrite NEGATIVE NEGATIVE   Leukocytes,Ua  NEGATIVE NEGATIVE   RBC / HPF >50 (H) 0 - 5 RBC/hpf   WBC, UA 0-5 0 - 5 WBC/hpf   Bacteria, UA RARE (A) NONE SEEN   Squamous Epithelial / LPF 0-5 0 - 5  Basic metabolic panel     Status: Abnormal   Collection Time: 12/08/19  8:31 PM  Result Value Ref Range   Sodium 135 135 - 145 mmol/L   Potassium 3.6 3.5 - 5.1 mmol/L   Chloride 101 98 - 111 mmol/L   CO2 20 (L) 22 - 32 mmol/L   Glucose, Bld 126 (H) 70 - 99 mg/dL   BUN 14 6 - 20 mg/dL   Creatinine, Ser 1.52 (H) 0.61 - 1.24 mg/dL   Calcium 9.3 8.9 - 10.3 mg/dL   GFR calc non Af Amer 53 (L) >60 mL/min   GFR calc Af Amer >60 >60 mL/min   Anion gap 14 5 - 15  CBC     Status: Abnormal   Collection Time: 12/08/19  8:31 PM  Result Value Ref Range   WBC 15.4 (H) 4.0 - 10.5 K/uL   RBC 5.47 4.22 - 5.81 MIL/uL   Hemoglobin 15.9 13.0 - 17.0 g/dL   HCT 45.2 39.0 - 52.0 %   MCV 82.6 80.0 - 100.0 fL   MCH 29.1 26.0 - 34.0 pg   MCHC 35.2 30.0 - 36.0 g/dL   RDW 11.8 11.5 - 15.5 %   Platelets 292 150 - 400 K/uL   nRBC 0.0 0.0 - 0.2 %   Recent Results (from the past 240 hour(s))  SARS CORONAVIRUS 2 (TAT 6-24 HRS) Nasopharyngeal Nasopharyngeal Swab     Status: None   Collection Time: 12/05/19 12:56 PM   Specimen: Nasopharyngeal Swab  Result Value Ref Range Status   SARS Coronavirus 2 NEGATIVE NEGATIVE Final    Comment: (NOTE) SARS-CoV-2 target nucleic acids are NOT DETECTED. The SARS-CoV-2 RNA is generally detectable in upper and lower respiratory specimens during the acute phase of infection. Negative results do not preclude SARS-CoV-2 infection, do not rule out co-infections with other pathogens, and should not be used as the sole basis for treatment or other patient management decisions. Negative results must be combined with clinical observations, patient history, and epidemiological information. The expected result is Negative. Fact Sheet for Patients: SugarRoll.be Fact Sheet for Healthcare  Providers: https://www.woods-mathews.com/ This test is not yet approved or cleared by the [place] FDA and  has been authorized for detection and/or diagnosis of SARS-CoV-2 by FDA under an Emergency Use Authorization (EUA). This EUA will remain  in effect (meaning this test can be used) for the duration of the COVID-19 declaration under Section 56 4(b)(1) of the Act, 21 U.S.C. section 360bbb-3(b)(1), unless the authorization is terminated or revoked sooner. Performed at [facility], [place] [address] [place], [place] [address]    Creatinine: Recent Labs  12/04/19 0921 12/08/19 2031  CREATININE 1.32* 1.52*   Baseline Creatinine: 1.2  Impression/Assessment:  48yo with left ureteral calculus, intractable pain  Plan:  1. I discussed the management of ureteral calculi with the patient including medical expulsive therapy, ureteral stent placement, nephrostomy tube placement. The patient wishes to try medical expulsive therapy and will be admitted for pain control.  Wilkie Aye 12/09/2019, 7:18 AM

## 2019-12-09 NOTE — Discharge Instructions (Signed)
Lithotripsy, Care After This sheet gives you information about how to care for yourself after your procedure. Your health care provider may also give you more specific instructions. If you have problems or questions, contact your health care provider. What can I expect after the procedure? After the procedure, it is common to have:  Some blood in your urine. This should only last for a few days.  Soreness in your back, sides, or upper abdomen for a few days.  Blotches or bruises on your back where the pressure wave entered the skin.  Pain, discomfort, or nausea when pieces (fragments) of the kidney stone move through the tube that carries urine from the kidney to the bladder (ureter). Stone fragments may pass soon after the procedure, but they may continue to pass for up to 4-8 weeks. ? If you have severe pain or nausea, contact your health care provider. This may be caused by a large stone that was not broken up, and this may mean that you need more treatment.  Some pain or discomfort during urination.  Some pain or discomfort in the lower abdomen or (in men) at the base of the penis. Follow these instructions at home: Medicines  Take over-the-counter and prescription medicines only as told by your health care provider.  If you were prescribed an antibiotic medicine, take it as told by your health care provider. Do not stop taking the antibiotic even if you start to feel better.  Do not drive for 24 hours if you were given a medicine to help you relax (sedative).  Do not drive or use heavy machinery while taking prescription pain medicine. Eating and drinking      Drink enough water and fluids to keep your urine clear or pale yellow. This helps any remaining pieces of the stone to pass. It can also help prevent new stones from forming.  Eat plenty of fresh fruits and vegetables.  Follow instructions from your health care provider about eating and drinking restrictions. You may be  instructed: ? To reduce how much salt (sodium) you eat or drink. Check ingredients and nutrition facts on packaged foods and beverages. ? To reduce how much meat you eat.  Eat the recommended amount of calcium for your age and gender. Ask your health care provider how much calcium you should have. General instructions  Get plenty of rest.  Most people can resume normal activities 1-2 days after the procedure. Ask your health care provider what activities are safe for you.  Your health care provider may direct you to lie in a certain position (postural drainage) and tap firmly (percuss) over your kidney area to help stone fragments pass. Follow instructions as told by your health care provider.  If directed, strain all urine through the strainer that was provided by your health care provider. ? Keep all fragments for your health care provider to see. Any stones that are found may be sent to a medical lab for examination. The stone may be as small as a grain of salt.  Keep all follow-up visits as told by your health care provider. This is important. Contact a health care provider if:  You have pain that is severe or does not get better with medicine.  You have nausea that is severe or does not go away.  You have blood in your urine longer than your health care provider told you to expect.  You have more blood in your urine.  You have pain during urination that does   not go away.  You urinate more frequently than usual and this does not go away.  You develop a rash or any other possible signs of an allergic reaction. Get help right away if:  You have severe pain in your back, sides, or upper abdomen.  You have severe pain while urinating.  Your urine is very dark red.  You have blood in your stool (feces).  You cannot pass any urine at all.  You feel a strong urge to urinate after emptying your bladder.  You have a fever or chills.  You develop shortness of breath,  difficulty breathing, or chest pain.  You have severe nausea that leads to persistent vomiting.  You faint. Summary  After this procedure, it is common to have some pain, discomfort, or nausea when pieces (fragments) of the kidney stone move through the tube that carries urine from the kidney to the bladder (ureter). If this pain or nausea is severe, however, you should contact your health care provider.  Most people can resume normal activities 1-2 days after the procedure. Ask your health care provider what activities are safe for you.  Drink enough water and fluids to keep your urine clear or pale yellow. This helps any remaining pieces of the stone to pass, and it can help prevent new stones from forming.  If directed, strain your urine and keep all fragments for your health care provider to see. Fragments or stones may be as small as a grain of salt.  Get help right away if you have severe pain in your back, sides, or upper abdomen or have severe pain while urinating. This information is not intended to replace advice given to you by your health care provider. Make sure you discuss any questions you have with your health care provider. Document Revised: 01/17/2019 Document Reviewed: 08/27/2016 Elsevier Patient Education  2020 [employer]  

## 2019-12-16 NOTE — Discharge Summary (Signed)
Physician Discharge Summary  Patient ID: Andrew [name] MRN: [id number] DOB/AGE: 1971-02-20 49 y.o.  Admit date: 12/08/2019 Discharge date: 12/09/2019 Admission Diagnoses:  Ureteral calculus  Discharge Diagnoses:  Active Problems:   Kidney stone   Ureteral calculus   Past Medical History:  Diagnosis Date  . Kidney stones     Surgeries:  on * No surgery found *   Consultants (if any):   Discharged Condition: Improved  Hospital Course: YAVUZ KIRBY is an 49 y.o. male who was admitted 12/08/2019 with a diagnosis of ureteral calculus. H ewas admitted for pain control. On hospital day #1 his pain was well controleld and he elected to be discharged home.  He was given perioperative antibiotics:  Anti-infectives (From admission, onward)   None    .  He was given sequential compression devices, early ambulation for DVT prophylaxis.  He benefited maximally from the hospital stay and there were no complications.    Recent vital signs:  Vitals:   12/09/19 0216 12/09/19 0419  BP: (!) 157/96 (!) 152/83  Pulse: 71 80  Resp: 18 16  Temp: 98.1 F (36.7 C) 98.3 F (36.8 C)  SpO2: 97% 96%    Recent laboratory studies:  Lab Results  Component Value Date   HGB 15.9 12/08/2019   HGB 17.0 12/04/2019   Lab Results  Component Value Date   WBC 15.4 (H) 12/08/2019   PLT 292 12/08/2019   No results found for: INR Lab Results  Component Value Date   NA 135 12/08/2019   K 3.6 12/08/2019   CL 101 12/08/2019   CO2 20 (L) 12/08/2019   BUN 14 12/08/2019   CREATININE 1.52 (H) 12/08/2019   GLUCOSE 126 (H) 12/08/2019    Discharge Medications:   Allergies as of 12/09/2019   No Known Allergies     Medication List    STOP taking these medications   HYDROcodone-acetaminophen 5-325 MG tablet Commonly known as: Norco   ibuprofen 600 MG tablet Commonly known as: ADVIL     TAKE these medications   ondansetron 4 MG tablet Commonly known as: Zofran Take 1 tablet  (4 mg total) by mouth every 8 (eight) hours as needed for nausea or vomiting.   tamsulosin 0.4 MG Caps capsule Commonly known as: FLOMAX Take 1 capsule (0.4 mg total) by mouth daily.     ASK your doctor about these medications   HYDROmorphone 2 MG tablet Commonly known as: Dilaudid Take 1 tablet (2 mg total) by mouth every 4 (four) hours as needed for up to 5 days for moderate pain or severe pain. Ask about: Should I take this medication?       Diagnostic Studies: DG Abd 1 View  Result Date: 12/08/2019 CLINICAL DATA:  Left-sided renal stone. EXAM: ABDOMEN - 1 VIEW COMPARISON:  10/03/2020. FINDINGS: Previously identified left renal stone is now projected over the left upper ureter at the level of L3. Pelvic calcifications again noted consistent phleboliths. Stool noted throughout the colon. No bowel distention. No acute bony abnormality. IMPRESSION: Previous identified left renal stone is now projected over the left upper ureter at the level of L3. Electronically Signed   By: Maisie Fus  Register   On: 12/08/2019 10:51   CT Renal Stone Study  Result Date: 12/08/2019 CLINICAL DATA:  Lithotripsy for left-sided kidney stone today. Worsening of left-sided pain. A EXAM: CT ABDOMEN AND PELVIS WITHOUT CONTRAST TECHNIQUE: Multidetector CT imaging of the abdomen and pelvis was performed following the standard protocol without IV  contrast. COMPARISON:  12/04/2019 FINDINGS: Lower chest: Normal Hepatobiliary: Liver appears normal.  Previous cholecystectomy. Pancreas: Normal Spleen: Normal Adrenals/Urinary Tract: Adrenal glands are normal. Right kidney is normal. Left kidney shows swelling and hydronephrosis. 2 mm nonobstructing stone dependent within the lower pole is unchanged. Stone present within the proximal ureter lower than was seen on the previous study, just below the level of the lower pole of the kidney. This measures maximally 6 mm. No stone seen distal to that. No stone in the bladder. No evidence  of regional hemorrhage. Pyelosinus extravasation is slightly more pronounced than seen previously. Stomach/Bowel: Normal Vascular/Lymphatic: Minimal aortoiliac atherosclerotic disease. Reproductive: Normal Other: No free fluid or air. Musculoskeletal: Mild chronic curvature and degenerative change. No acute finding. IMPRESSION: 6 mm stone previously seen at the left UPJ has migrated about 4 cm distal, just below the lower pole of the left kidney. This does not appear to be fragmented. Nonobstructing 2 mm stone in the lower pole is unchanged. Persistent hydronephrosis. More surrounding retroperitoneal edema consistent with pyelo sinus extravasation. No evidence of postprocedural hematoma. Electronically Signed   By: Nelson Chimes M.D.   On: 12/08/2019 22:16   CT Renal Stone Study  Result Date: 12/04/2019 CLINICAL DATA:  Left flank pain with nausea and vomiting beginning the morning at 4am. EXAM: CT ABDOMEN AND PELVIS WITHOUT CONTRAST TECHNIQUE: Multidetector CT imaging of the abdomen and pelvis was performed following the standard protocol without IV contrast. COMPARISON:  None. FINDINGS: Lower chest: No acute abnormality. Somewhat limited evaluation of the abdominal viscera given the lack of IV contrast. Hepatobiliary: Tiny hypodensity in the posterior left liver (series 4, image 13) is too small fully characterize but likely represents a small cyst. Status post cholecystectomy. No biliary dilatation. Pancreas: Unremarkable. No surrounding inflammatory changes. Spleen: Normal in size without focal abnormality. Adrenals/Urinary Tract: Adrenal glands are unremarkable. Kidneys are symmetric in size. No right renal calculi or hydronephrosis. There is mild left hydronephrosis with a 6 mm calculus proximally at the UPJ. Mild left perinephric fat stranding. There is another punctate calculus in the left kidney. Bladder is unremarkable. Stomach/Bowel: Stomach is within normal limits. Appendix appears normal. No evidence  of bowel wall thickening, distention, or inflammatory changes. Few scattered colonic diverticula without evidence of diverticulitis. Vascular/Lymphatic: Minimal aortoiliac atherosclerosis without aneurysm. No enlarged abdominal or pelvic lymph nodes. Reproductive: Prostate is unremarkable. Other: No abdominal wall hernia or abnormality. No abdominopelvic ascites. Musculoskeletal: Degenerative disc disease at L1-2 with likely chronic wedging of the L1 vertebral body secondary to degenerative change. IMPRESSION: 1. Mild left hydronephrosis secondary to a 6 mm calculus proximally at the UPJ. 2.  Lumbar spine degenerative disc disease. Aortic Atherosclerosis (ICD10-I70.0). Electronically Signed   By: Audie Pinto M.D.   On: 12/04/2019 10:29    Disposition: Discharge disposition: 01-Home or [place], Larry Ryan, NP. Call in 2 week(s).   Specialty: Nurse Practitioner Contact information: [address] [place] [address] [phone]            Signed: Nicolette Bang 12/16/2019, 7:59 AM

## 2019-12-28 ENCOUNTER — Other Ambulatory Visit: Payer: Self-pay | Admitting: Urology

## 2021-09-30 IMAGING — CT CT RENAL STONE PROTOCOL
2 of 4 series · 16 of 46 positions shown, 18 images · non-contrast
Comparison: 12/04/2019

CLINICAL DATA: Lithotripsy for left-sided kidney stone today.
Worsening of left-sided pain. A

EXAM:
CT ABDOMEN AND PELVIS WITHOUT CONTRAST
TECHNIQUE: Multidetector CT imaging of the abdomen and pelvis was performed
following the standard protocol without IV contrast.

[Series 3: stone study 5.0 i30f 2 · axial · 0.97mm/px · z∈[+753,+1258]mm · 13 of 111 slices shown, 15 images]
[im 5/111  soft-tissue]
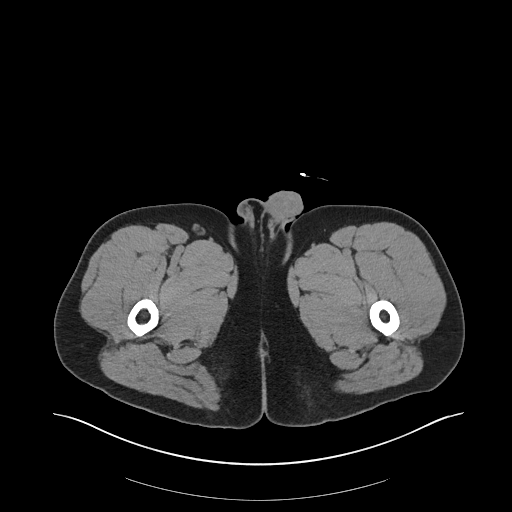
[im 5/111  bone]
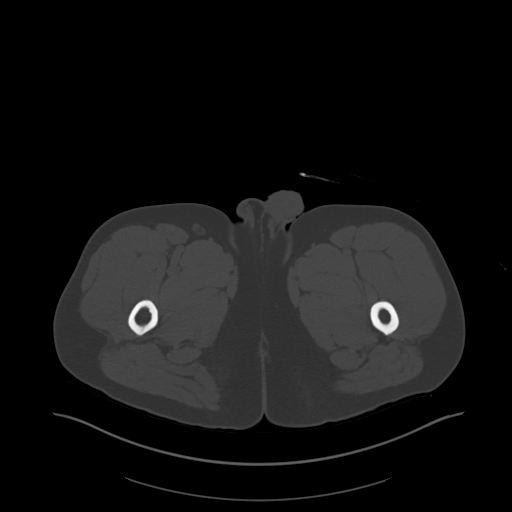
[im 13/111  soft-tissue]
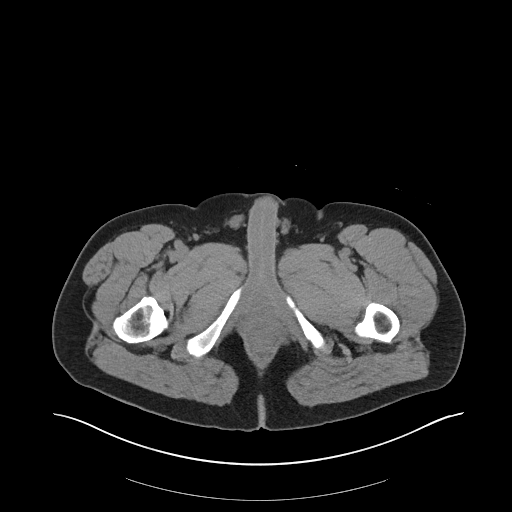
[im 22/111  soft-tissue]
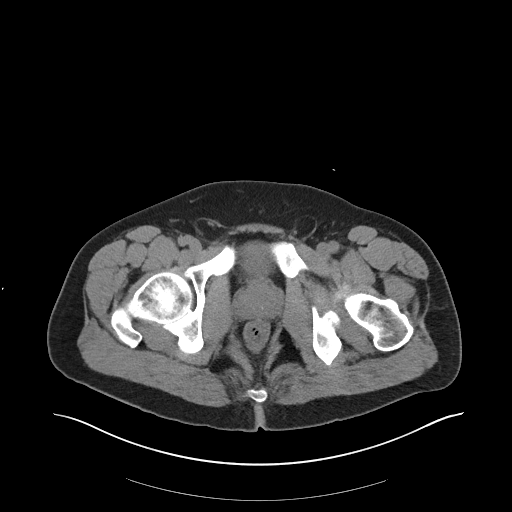
[im 30/111  soft-tissue]
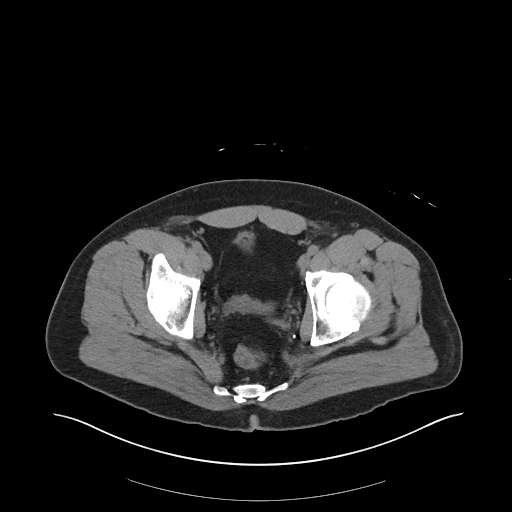
[im 39/111  soft-tissue]
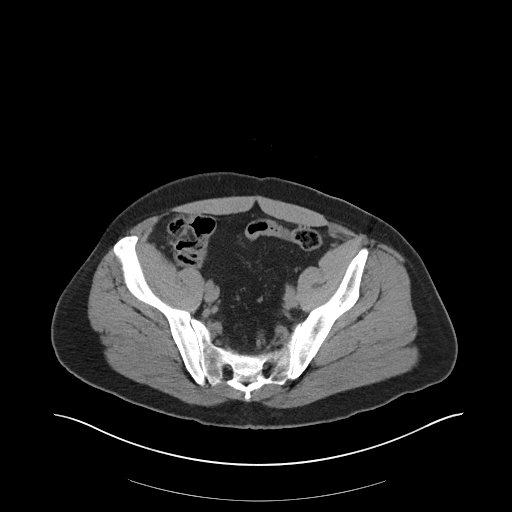
[im 47/111  soft-tissue]
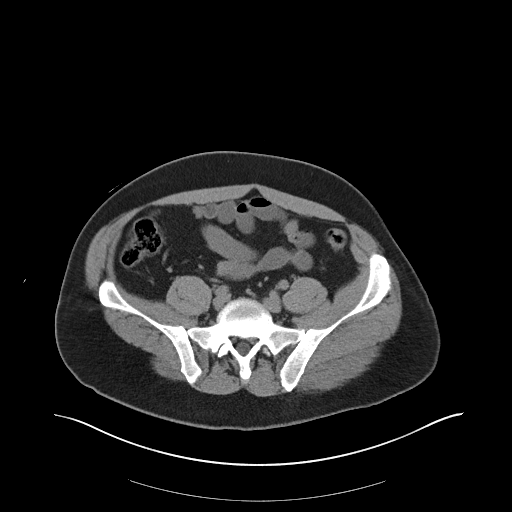
[im 56/111  soft-tissue]
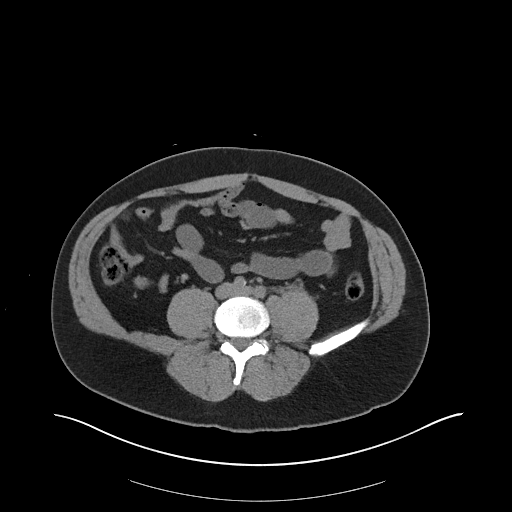
[im 64/111  soft-tissue]
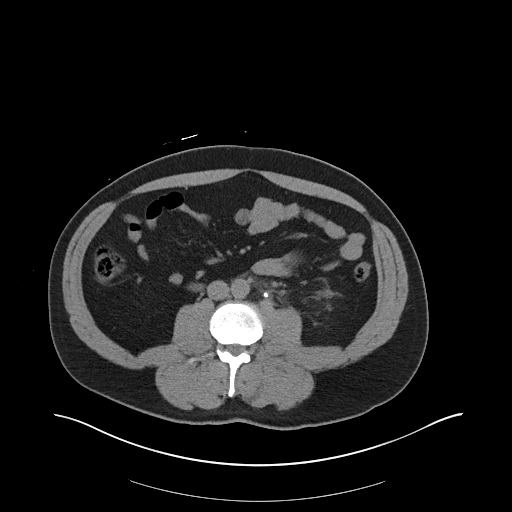
[im 72/111  soft-tissue]
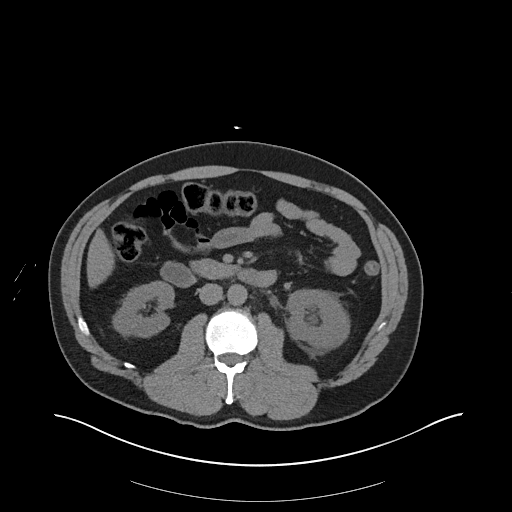
[im 72/111  bone]
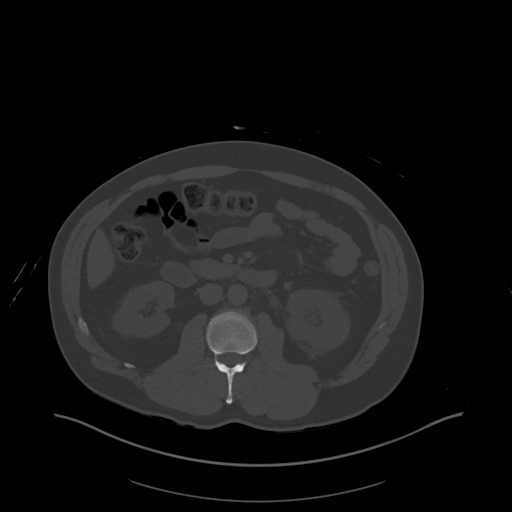
[im 81/111  soft-tissue]
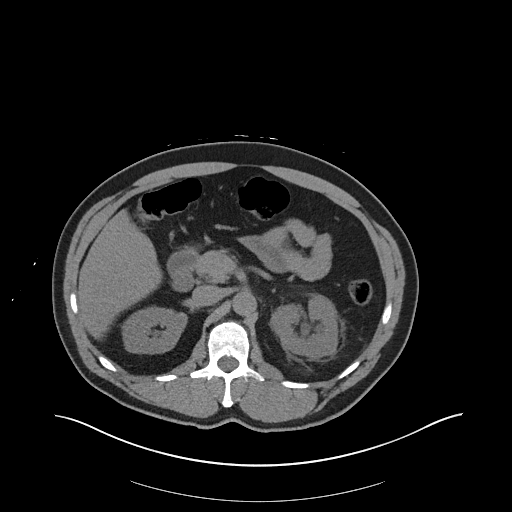
[im 89/111  soft-tissue]
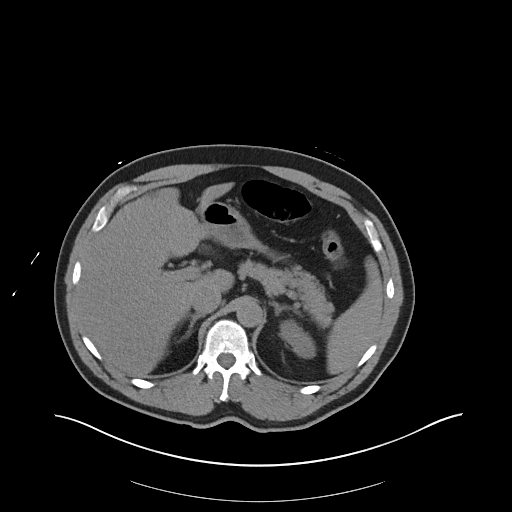
[im 98/111  soft-tissue]
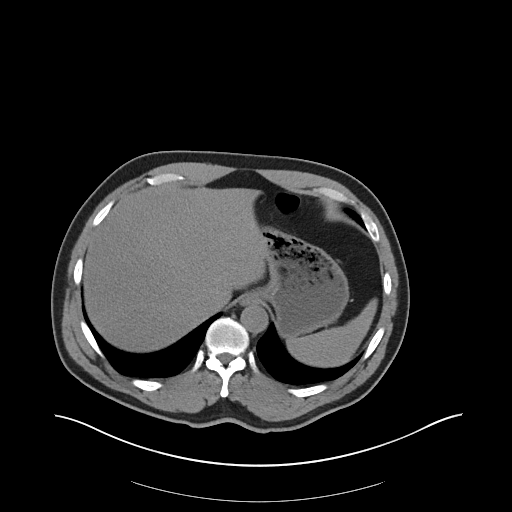
[im 106/111  soft-tissue]
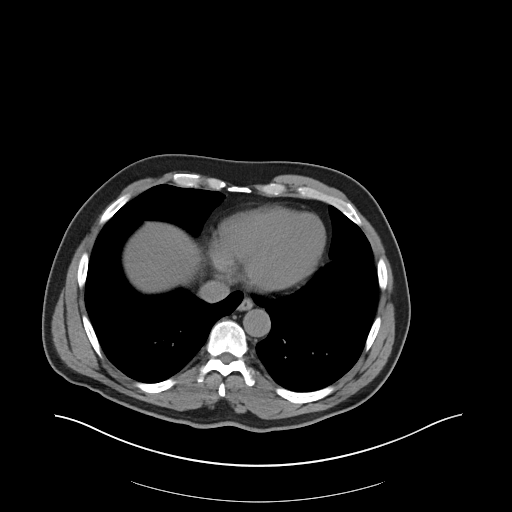

[Series 6: coronal soft tissue · coronal · 0.94mm/px · 3 of 120 slices shown]
[im 40/120  soft-tissue]
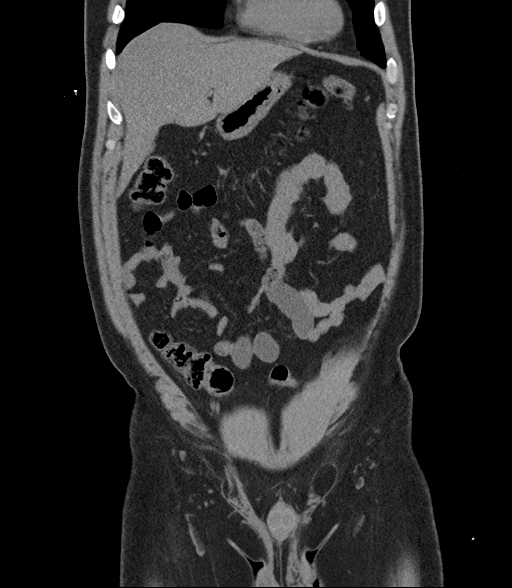
[im 53/120  soft-tissue]
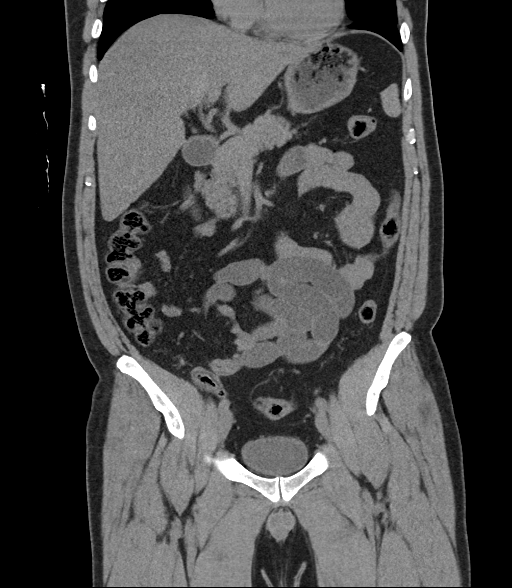
[im 67/120  soft-tissue]
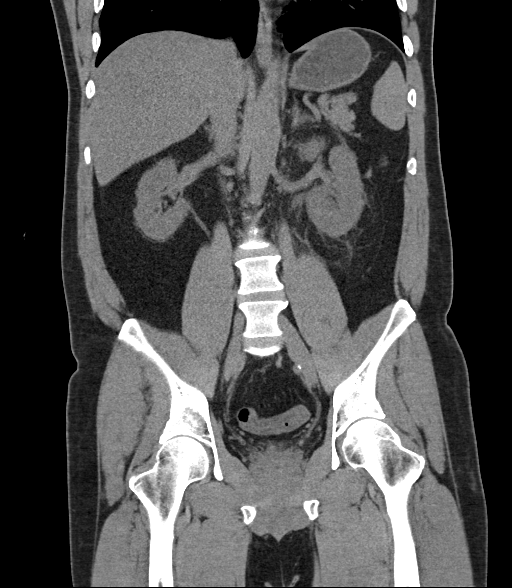

[16 of 46 positions shown; findings below may reference images not displayed]

FINDINGS: Lower chest: Normal

Hepatobiliary: Liver appears normal.  Previous cholecystectomy.

Pancreas: Normal

Spleen: Normal

Adrenals/Urinary Tract: Adrenal glands are normal. Right kidney is
normal. Left kidney shows swelling and hydronephrosis. 2 mm
nonobstructing stone dependent within the lower pole is unchanged.
Stone present within the proximal ureter lower than was seen on the
previous study, just below the level of the lower pole of the
kidney. This measures maximally 6 mm. No stone seen distal to that.
No stone in the bladder. No evidence of regional hemorrhage.
Pyelosinus extravasation is slightly more pronounced than seen
previously.

Stomach/Bowel: Normal

Vascular/Lymphatic: Minimal aortoiliac atherosclerotic disease.

Reproductive: Normal

Other: No free fluid or air.

Musculoskeletal: Mild chronic curvature and degenerative change. No
acute finding.
IMPRESSION: 6 mm stone previously seen at the left UPJ has migrated about 4 cm
distal, just below the lower pole of the left kidney. This does not
appear to be fragmented. Nonobstructing 2 mm stone in the lower pole
is unchanged. Persistent hydronephrosis. More surrounding
retroperitoneal edema consistent with pyelo sinus extravasation. No
evidence of postprocedural hematoma.
